# Patient Record
Sex: Male | Born: 1955 | Race: White | Hispanic: No | Marital: Married
Health system: Southern US, Community
[De-identification: ages and names within clinical notes are randomized; demographics above are authoritative.]

---

## 2012-06-03 LAB — DRUG SCREEN, URINE
Amphetamines, Ur Screen: NEGATIVE (ref ?–1000)
Barbiturates, Ur Screen: NEGATIVE (ref ?–200)
Cannabinoid 50 Ng, Ur ~~LOC~~: NEGATIVE (ref ?–50)
Cocaine Metabolite,Ur ~~LOC~~: NEGATIVE (ref ?–300)
Tricyclic, Ur Screen: NEGATIVE (ref ?–1000)

## 2012-06-03 LAB — CBC
HCT: 43 % (ref 40.0–52.0)
MCH: 34.4 pg — ABNORMAL HIGH (ref 26.0–34.0)
MCV: 96 fL (ref 80–100)
Platelet: 165 10*3/uL (ref 150–440)
RBC: 4.47 10*6/uL (ref 4.40–5.90)
RDW: 12.8 % (ref 11.5–14.5)

## 2012-06-03 LAB — URINALYSIS, COMPLETE
Bilirubin,UR: NEGATIVE
Glucose,UR: NEGATIVE mg/dL (ref 0–75)
Leukocyte Esterase: NEGATIVE
Ph: 6 (ref 4.5–8.0)
Specific Gravity: 1.004 (ref 1.003–1.030)
Squamous Epithelial: NONE SEEN
WBC UR: <1 /HPF (ref 0–5)

## 2012-06-03 LAB — TSH: Thyroid Stimulating Horm: 1.73 u[IU]/mL

## 2012-06-03 LAB — COMPREHENSIVE METABOLIC PANEL
Chloride: 102 mmol/L (ref 98–107)
Co2: 27 mmol/L (ref 21–32)
Creatinine: 0.69 mg/dL (ref 0.60–1.30)
EGFR (Non-African Amer.): >60
Glucose: 107 mg/dL — ABNORMAL HIGH (ref 65–99)
Total Protein: 6.6 g/dL (ref 6.4–8.2)

## 2012-06-03 LAB — ETHANOL: Ethanol %: 0.246 % — ABNORMAL HIGH (ref 0.000–0.080)

## 2012-06-05 ENCOUNTER — Inpatient Hospital Stay: Payer: Self-pay | Admitting: Psychiatry

## 2013-01-30 LAB — CBC
HGB: 16 g/dL (ref 13.0–18.0)
MCH: 32.7 pg (ref 26.0–34.0)
Platelet: 137 10*3/uL — ABNORMAL LOW (ref 150–440)
RBC: 4.91 10*6/uL (ref 4.40–5.90)
RDW: 15.5 % — ABNORMAL HIGH (ref 11.5–14.5)
WBC: 6.8 10*3/uL (ref 3.8–10.6)

## 2013-01-30 LAB — ETHANOL: Ethanol %: 0.233 % — ABNORMAL HIGH (ref 0.000–0.080)

## 2013-01-30 LAB — TSH: Thyroid Stimulating Horm: 1.14 u[IU]/mL

## 2013-01-31 ENCOUNTER — Inpatient Hospital Stay: Payer: Self-pay | Admitting: Psychiatry

## 2013-05-31 ENCOUNTER — Emergency Department: Payer: Self-pay | Admitting: Emergency Medicine

## 2013-06-07 ENCOUNTER — Emergency Department: Payer: Self-pay | Admitting: Internal Medicine

## 2013-08-30 ENCOUNTER — Ambulatory Visit: Payer: Self-pay | Admitting: Otolaryngology

## 2013-11-29 LAB — COMPREHENSIVE METABOLIC PANEL
Albumin: 3.9 g/dL (ref 3.4–5.0)
Alkaline Phosphatase: 93 U/L (ref 50–136)
Anion Gap: 8 (ref 7–16)
Bilirubin,Total: 0.5 mg/dL (ref 0.2–1.0)
Calcium, Total: 8.2 mg/dL — ABNORMAL LOW (ref 8.5–10.1)
EGFR (Non-African Amer.): >60
Glucose: 115 mg/dL — ABNORMAL HIGH (ref 65–99)
Sodium: 138 mmol/L (ref 136–145)
Total Protein: 7.4 g/dL (ref 6.4–8.2)
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
## 2014-04-30 ENCOUNTER — Ambulatory Visit: Payer: Self-pay | Admitting: Ophthalmology

## 2014-06-29 IMAGING — CT CT NECK-CHEST W/ CM
2 series · 10 of 14 positions shown, 12 images · IV contrast (isovue)
Comparison: None

REASON FOR EXAM: left neck mass at neck base
COMMENTS:

PROCEDURE:     KCT - KCT NECK AND CHEST WITH CONTRAST  - August 30, 2013  [DATE]
RESULT:     CT NECK, CHEST
HISTORY: Left neck mass
TECHNIQUE: Multiple axial images obtained of the neck and chest, without
p.o. contrast and with  75 ml of Isovue 370 intravenous contrast.

[Series 2: neck_chest w/ 3.0 i41f 3 · axial · 0.74mm/px · z∈[-463,-49]mm · 6 of 174 slices shown, 8 images]
[im 25/174  soft-tissue]
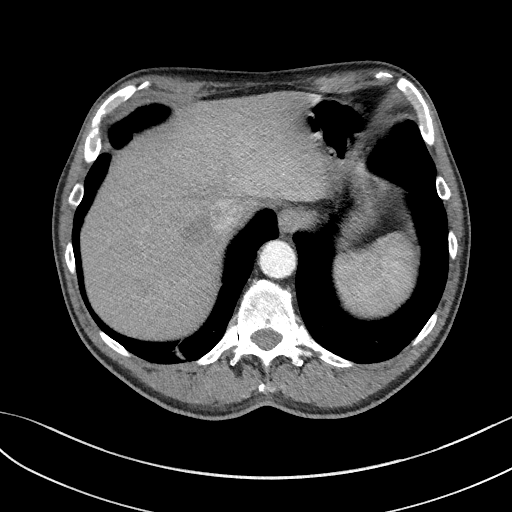
[im 25/174  bone]
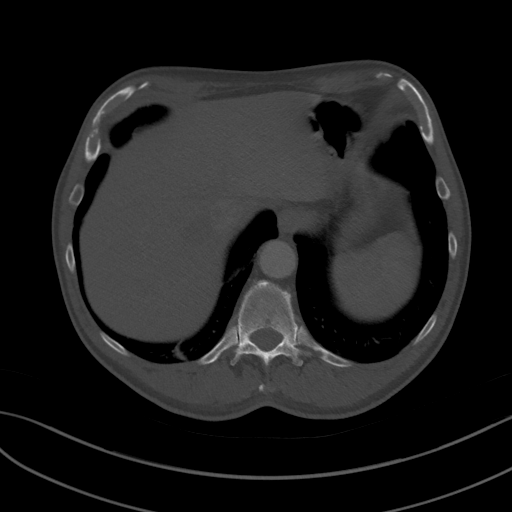
[im 50/174  bone]
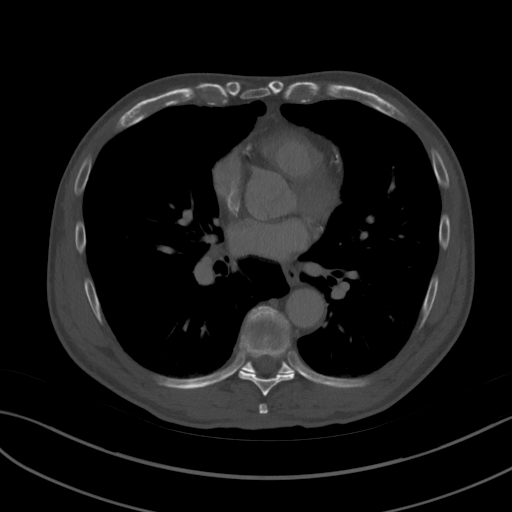
[im 75/174  bone]
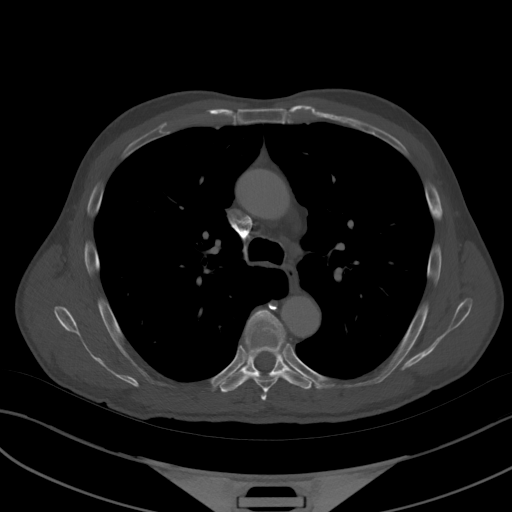
[im 99/174  bone]
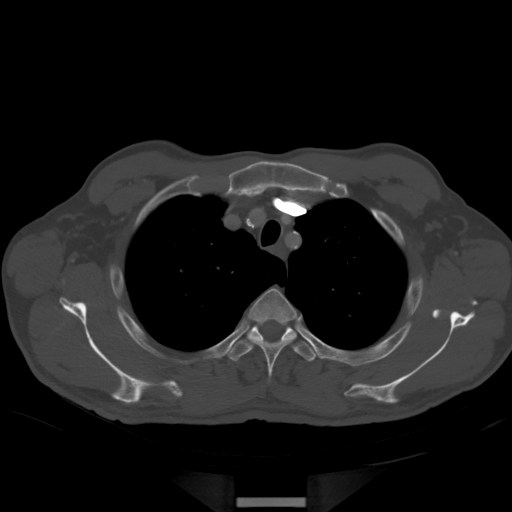
[im 124/174  soft-tissue]
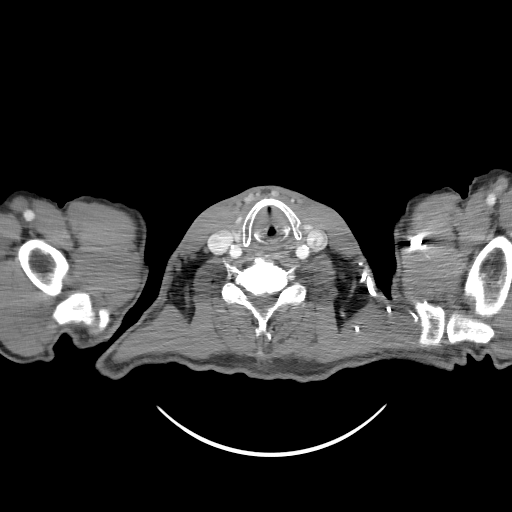
[im 124/174  bone]
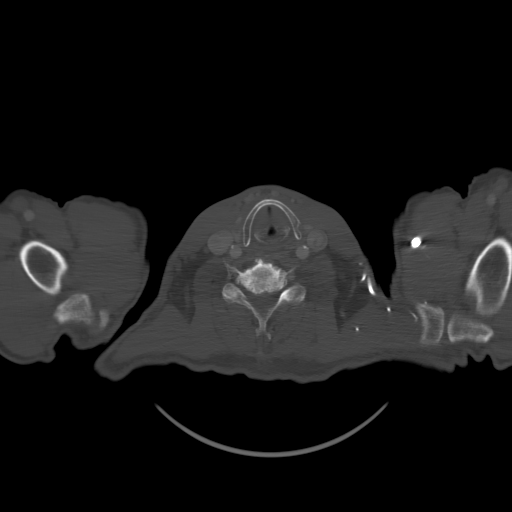
[im 149/174  bone]
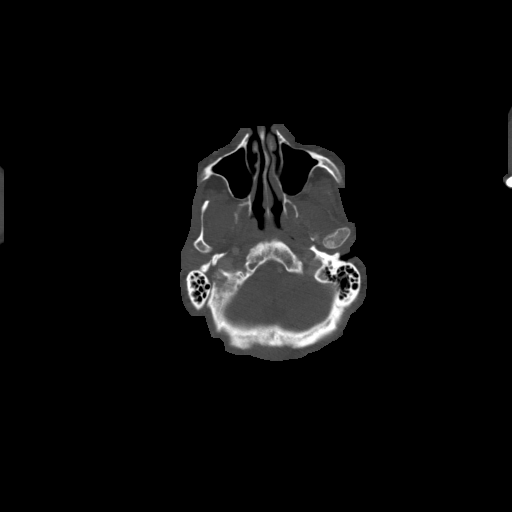

[Series 4: lung · axial · 0.74mm/px · z∈[-468,-255]mm · 4 of 119 slices shown]
[im 24/119  bone]
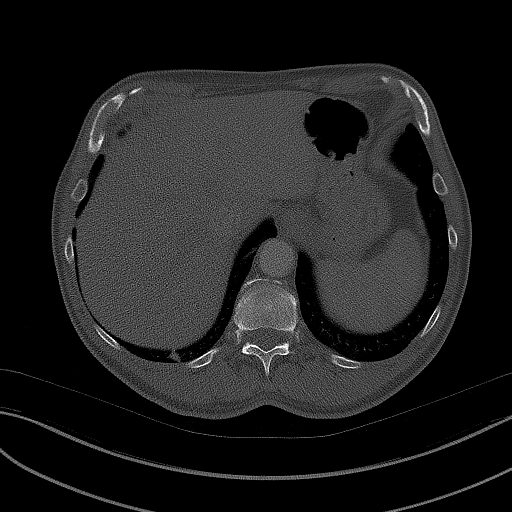
[im 48/119  bone]
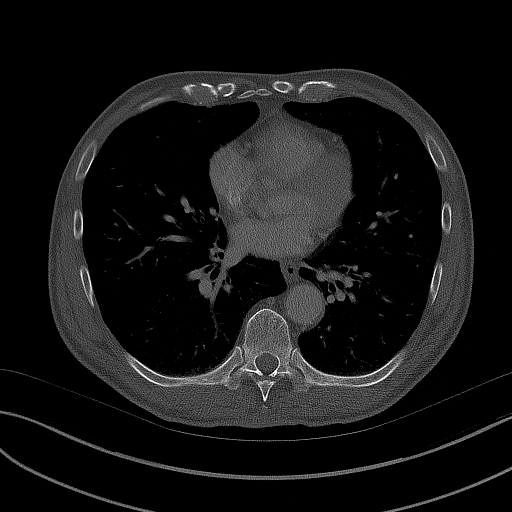
[im 71/119  bone]
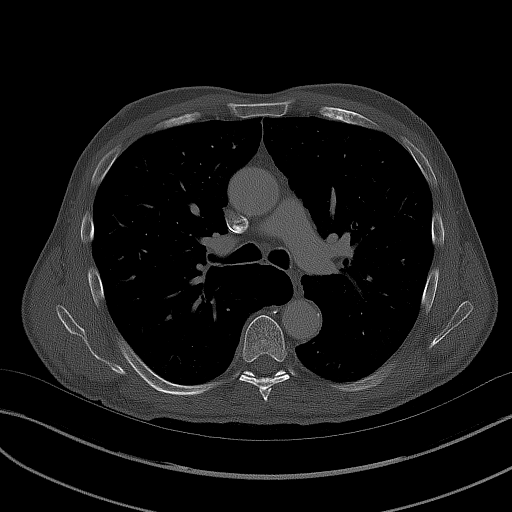
[im 95/119  bone]
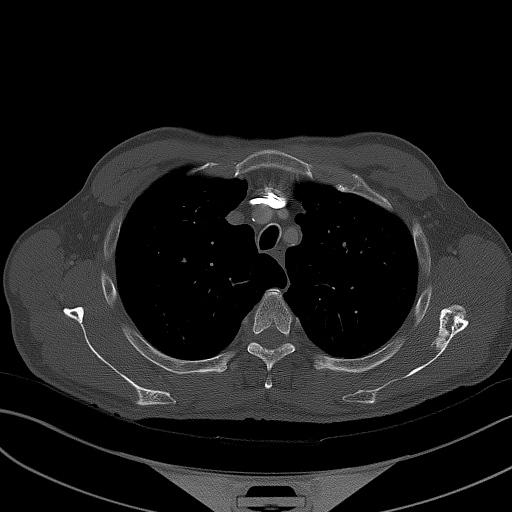

[10 of 14 positions shown; findings below may reference images not displayed]

FINDINGS: NECK:

There is no lymphadenopathy. There is no nasopharyngeal or oropharyngeal
mass. There is no focal fluid collection. The airway is patent. The
visualized portions of the carotid arteries and internal jugular veins are
patent. There is bilateral carotid artery atherosclerosis.

The visualized portions of the brain demonstrate no focal abnormality.

The paranasal sinuses are clear.

There is no lytic or blastic osseous lesion.

CHEST:

The lungs are clear. There bilateral emphysematous changes. There is a small
area of scarring at the right lung base. There is no focal mass. There is no
focal parenchymal opacity, pleural effusion, or pneumothorax.

The heart size is normal. There is no pericardial effusion. There is
coronary artery atherosclerosis in the left main, LAD and right coronary
artery.

There are no pathologically enlarged mediastinal, hilar, or axillary lymph
nodes.

There is a healing versus healed left posterior eleventh rib fracture.
IMPRESSION: 1. There is no evidence of a mass at the site of clinical palpable
abnormality in the left anterolateral lower neck.

2. Coronary artery disease.

3. Carotid artery atherosclerosis bilaterally.

[REDACTED]

## 2015-04-18 NOTE — Consult Note (Signed)
Brief Consult Note: Diagnosis: alcohol dep.   Patient was seen by consultant.   Consult note dictated.   Orders entered.   Comments: Psychiatry: Alcohol dep. [facility]. Orders done.  Electronic Signatures: Audery Amellapacs, Peityn Payton T (MD)  (Signed 05-Feb-14 09:50)  Authored: Brief Consult Note   Last Updated: 05-Feb-14 09:50 by Audery Amellapacs, Jaymon Dudek T (MD)

## 2015-04-18 NOTE — Discharge Summary (Signed)
PATIENT NAME:  Blake Villa, Blake Villa MR#:  [id number] DATE OF BIRTH:  11-17-1956  DATE OF ADMISSION:  01/31/2013  DATE OF DISCHARGE:  02/02/2013  HOSPITAL COURSE: See dictated history and physical for details of admission. This 59 year old man with a history of alcohol dependence came to the hospital requesting detox. He was treated with the usual CIWA protocol. He tolerated this well with no seizures and no sign of delirium. As of today, his vital signs are stable. He is not showing any tremor or any signs of active alcohol withdrawal. He has participated in groups appropriately. Mood is calm and stable. He is sleeping well. He was educated about the importance of staying sober and was agreeable to a referral to the Intensive Outpatient Program. Dr. Maisie Fushomas from the IOP saw him today and has excepted him. The plan is for discharge today with the patient to follow-up with IOP and with Alcoholics Anonymous.   DISCHARGE MEDICATIONS: Albuterol measured dose inhaler 2 puffs q.4 hours p.r.n. shortness of breath, Zocor 20 mg at night, trazodone 100 mg at night alternating with Remeron 15 mg at night for sleep.   LABORATORY RESULTS: Thyroid stimulating hormone normal at 1.1. Admission alcohol level 233. Calcium low at 8.2. Glucose slightly high at 115, AST elevated at 46. Platelet count low at 137.   MENTAL STATUS EXAM: Neatly dressed and groomed man. Good eye contact. Normal psychomotor activity. Speech normal rate, tone, and volume. Affect euthymic, reactive and appropriate. Mood stated as good. Thoughts are lucid and directed with no evidence of loosening of associations or delusional thinking. Denies suicidal or homicidal ideation. Shows good insight and judgment. Normal intelligence.   DISPOSITION: Discharge home. Follow up with AA and IOP program.   DIAGNOSIS PRINCIPLE AND PRIMARY:  AXIS I: Alcohol dependence.   SECONDARY DIAGNOSES:  AXIS I: No further.  AXIS II: No diagnosis.  AXIS III: Asthma.  AXIS  IV: Moderate chronic stress from his substance abuse problem and work difficulties.  AXIS V: Functioning at time of discharge 58.   ____________________________ Blake AmelJohn T. Wise Fees, MD jtc:jm Villa: 02/02/2013 18:05:14 ET T: 02/03/2013 13:24:40 ET JOB#: [id number]  cc: Blake AmelJohn T. Adonai Helzer, MD, <Dictator> Blake AmelJOHN T Zayyan Mullen MD ELECTRONICALLY SIGNED 02/05/2013 18:16

## 2015-04-18 NOTE — H&P (Signed)
PATIENT NAME:  Blake Villa, Blake Villa MR#:  [id number] DATE OF BIRTH:  12/15/1956  DATE OF ADMISSION:  01/30/2013  IDENTIFYING INFORMATION AND CHIEF COMPLAINT: A 59 year old man with a history of alcohol abuse, presented himself to the Emergency Room requesting detox.   CHIEF COMPLAINT:  "I need to get detox."   HISTORY OF PRESENT ILLNESS:  The patient states that he had been sober for several months, but he relapsed and for the last 2 weeks has been on a binge. He has been drinking alcohol pretty much all the way through the day to keep himself intoxicated around the clock. Recently, a friend of his had moved into his house briefly, who was also drinking, which seems to have escalated it. The patient realized in the last couple of days that he was running out of money and would not be able to do his job given the way he is drinking. He feels like he is unable to stop drinking on his own at home, because he is unable to resist the temptation of alcohol. He values his job and is afraid of losing it. He came to the hospital requesting detox. His mood has just been a little bit shaky. Does not report suicidal ideation. Does not report psychotic symptoms. He denies that he has been abusing any other drugs.   PAST PSYCHIATRIC HISTORY:  The patient has had a long history of alcohol dependence. He has managed to have extended periods of sobriety in the past. He claims that Alcoholics Anonymous has been effective for him, and he says that he has been actively involved in a group in [place]. He does not report any history of delirium tremens. Does not report any history of alcohol withdrawal seizures. It does not appear that he has been treated for mental health problems other than his alcohol abuse in the past. No history of suicide attempts or violence.   ALLERGIES: No known drug allergies.    SOCIAL HISTORY:  The patient lives by himself, although it sounds like he has some social support in the area. 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 He does have insurance.   PAST MEDICAL HISTORY:  The patient has COPD from chronic smoking. Also, apparently has dyslipidemia and has been treated with simvastatin.   FAMILY HISTORY:  Not stated.   REVIEW OF SYSTEMS:  Complains of feeling shaky. Tired. Feeling like he cannot stop drinking on his own. Mood is feeling somewhat nervous. Denies suicidal or homicidal ideation. Has had fleeting occasional visual illusions, but it does not sound like he has had full-blown hallucinosis.   MENTAL STATUS EXAM:  Disheveled man who looks his stated age, cooperative and pleasant in the interview. Good eye contact. Psychomotor activity a bit slowed. Speech: Easy to understand. Affect blunted. Mood stated as being nervous. Thoughts are lucid. No loosening of associations or delusions. Denies auditory or visual hallucinations currently. Denies suicidal or homicidal ideation. The patient appears to be of average intelligence. Alert and oriented x4 and has reasonable insight and judgment.   PHYSICAL EXAMINATION: GENERAL:  The patient has a mild intention tremor when he moves. When he stands up, he gets a little bit unsteady on his feet, but does not fall over. No obvious skin lesions.  HEENT:  Pupils equal and reactive. Face is symmetric.  NECK AND BACK:  Not tender to light touch.  NEUROLOGIC:  Strength and  reflexes normal throughout. Normal gait except for being a little bit unsteady.  LUNGS:  Clear with no wheezes.  HEART:  Regular rate and rhythm.  ABDOMEN:  Soft, nontender, normal bowel sounds.  VITAL SIGNS:  Pulse 88, respirations 19, blood pressure 159/89, temperature 97.9.   LABORATORY RESULTS:  Alcohol level last night, at about 6:00, was 233. TSH normal at 1.4. Chemistry showed a slightly elevated glucose at 115, calcium low at 8.2, AST elevated at 46. CBC showed a low platelet count at 137.    ASSESSMENT:  This is a 59 year old man with alcohol dependence, presents to the hospital requesting detox. He was intoxicated yesterday, and is shaky and showing some mild symptoms of alcohol withdrawal. Has not been able to maintain sobriety on his own. Significantly impaired by his drinking. Needs admission to the hospital for detox.   TREATMENT PLAN: Admit to psychiatry. Usual CIWA protocol. Continue on albuterol inhaler and simvastatin. Include him in groups and activities for education and substance abuse treatment. I have recommended to him that he consider going to the intensive outpatient program, and he says he will consider it.   DIAGNOSIS PRINCIPLE AND PRIMARY:  AXIS I:  Alcohol dependence.   SECONDARY DIAGNOSES: AXIS I:  No further axis.  AXIS II:  Deferred.  AXIS III:  Alcohol withdrawal, chronic obstructive pulmonary disease, dyslipidemia.  AXIS IV:  Moderate from ongoing worry about the loss of his job and lack of resources.  AXIS V:  Functioning at time of admission 40.      ____________________________ Blake AmelJohn T. Clapacs, MD jtc:dm D: 01/31/2013 09:57:00 ET T: 01/31/2013 10:14:36 ET JOB#: [id number]  cc: Blake AmelJohn T. Clapacs, MD, <Dictator> Blake AmelJOHN T CLAPACS MD ELECTRONICALLY SIGNED 02/01/2013 0:23

## 2015-04-18 NOTE — Consult Note (Signed)
Consult: Outpatient treatment recommendations Patient was interviewed and oriented to the CD-IOP prior to this discharge and was seen as an excellent candidate for treatment at this level of care and was also able to agree that at this inpatient discharge he plans to attend the CD-IOP. Dr. Carola FrostLee Ann Roush, PsyD, HSP-P informed of plans of patient to attend the [facility] CD-IOP. Plans are to meet with patient Tuesday at 4PM to complete admission intake in order to He has agreed to participate in the CD-IOP treatment at this discharge starting Tuesday February 02, 2005.   Electronic Signatures: Huel Cotehomas, Richard (PsyD)  (Signed on 07-Feb-14 18:20)  Authored  Last Updated: 07-Feb-14 18:20 by Huel Cotehomas, Richard (PsyD)

## 2015-04-19 NOTE — Op Note (Signed)
PATIENT NAME:  Blake SnookMARTIN, Torey D MR#:  [id number] DATE OF BIRTH:  August 14, 1956  DATE OF PROCEDURE:  Aug 05, 202015  LOCATION:  [facility]  PREOPERATIVE DIAGNOSIS: Visually significant cataract of the right eye.   POSTOPERATIVE DIAGNOSIS: Visually significant cataract of the right eye.   OPERATIVE PROCEDURE: Cataract extraction by phacoemulsification with implant of intraocular lens to right eye.   SURGEON: Galen ManilaWilliam Abigial Newville, MD.   ANESTHESIA:  1. Managed anesthesia care.  2. Topical tetracaine drops followed by 2% Xylocaine jelly applied in the preoperative holding area.   COMPLICATIONS: None.   TECHNIQUE:  Stop and chop.   DESCRIPTION OF PROCEDURE: The patient was examined and consented in the preoperative holding area where the aforementioned topical anesthesia was applied to the right eye and then brought back to the operating room where the right eye was prepped and draped in the usual sterile ophthalmic fashion and a lid speculum was placed. A paracentesis was created with the side port blade and the anterior chamber was filled with viscoelastic. A near clear corneal incision was performed with the steel keratome. A continuous curvilinear capsulorrhexis was performed with a cystotome followed by the capsulorrhexis forceps. Hydrodissection and hydrodelineation were carried out with BSS on a blunt cannula. The lens was removed in a stop and chop technique and the remaining cortical material was removed with the irrigation-aspiration handpiece. The capsular bag was inflated with viscoelastic and the Tecnis ZCB00 20.5-diopter lens, serial number [id number] was placed in the capsular bag without complication. The remaining viscoelastic was removed from the eye with the irrigation-aspiration handpiece. The wounds were hydrated. The anterior chamber was flushed with Miostat and the eye was inflated to physiologic pressure. 0.1 mL of cefuroxime concentration 10 mg/mL was placed in the anterior  chamber. The wounds were found to be water tight. The eye was dressed with Vigamox and Combigan. The patient was given protective glasses to wear throughout the day and a shield with which to sleep tonight. The patient was also given drops with which to begin a drop regimen today and will follow up with me in one day.   ____________________________ Jerilee FieldWilliam L. Kairie Vangieson, MD wlp:dd D: Aug 05, 202015 22:33:06 ET T: 05/01/2014 04:17:47 ET JOB#: [id number]  cc: Alohilani Levenhagen L. Narjis Mira, MD, <Dictator> Jerilee FieldWILLIAM L Dal Blew MD ELECTRONICALLY SIGNED 05/09/2014 13:29

## 2015-04-20 NOTE — H&P (Signed)
PATIENT NAME:  Blake Villa, Blake Villa MR#:  [id number] DATE OF BIRTH:  29-Sep-1956  DATE OF ADMISSION:  06/05/2012  REFERRING PHYSICIAN: Daryel NovemberJonathan Williams, MD   ATTENDING PHYSICIAN: Blake LineaJolanta Xabi Wittler, MD   IDENTIFYING DATA: Mr. Blake Villa is a [age] year old male with history of alcoholism.   CHIEF COMPLAINT: " I need detox."   HISTORY OF PRESENT ILLNESS: Mr. Blake Villa has been drinking since the age of 59. His drinking escalated nine months ago when he split with his wife. He now lives alone and drinks every day after work, 6 to 12 beers a day. He started missing work last week and decided to come for detox. He denies symptoms of depression, anxiety, or psychosis. He denies other than alcohol substance use, including illicit substances. He does not have enough time of work to participate in alcohol rehabilitation and wants to return to work following detox, maybe with intensive outpatient program following discharge.   PAST PSYCHIATRIC HISTORY: He has been drinking since the age of 59, no period of sobriety to speak of. This contributed to dissipation of his marriage. He is in a new relationship with a woman who does not drink and hopes that when he sobers up it will work well for him. He denies ever being hospitalized. No suicide attempts. No treatment.   FAMILY PSYCHIATRIC HISTORY: Multiple family members with alcoholism.   PAST MEDICAL HISTORY:  1. Chronic obstructive pulmonary disease.  2. Dyslipidemia.  3. Recent sinus infection, on amoxicillin.   ALLERGIES: No known drug allergies.   MEDICATIONS ON ADMISSION:  1. Albuterol inhaler.  2. Simvastatin 20 mg at night. 3. Amoxicillin 250 mg b.i.Villa.    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 He considers it a stressful job but likes it and is good at it. He has been missing work lately and decided to come for treatment. He is still  married but shortly will be divorced. He lives alone in a place where he has no neighbors. When he comes back from work, he sits at home and drinks until he falls asleep with his curtains closed. He does have a girlfriend. She spends weekends with [place] She seems to have a good influence on him. He would hate to lose the job and will do his best to maintain sobriety.    REVIEW OF SYSTEMS: CONSTITUTIONAL: No fevers or chills. No weight changes. EYES: No double or blurred vision. ENT: No hearing loss. RESPIRATORY: No shortness of breath or cough. CARDIOVASCULAR: No chest pain or orthopnea. GASTROINTESTINAL: No abdominal pain, nausea, vomiting, or diarrhea. GU: No incontinence or frequency. ENDOCRINE: No heat or cold intolerance. LYMPHATIC: No anemia or easy bruising. INTEGUMENTARY: No acne or rash. MUSCULOSKELETAL: No muscle or joint pain. NEUROLOGIC: No tingling or weakness. PSYCHIATRIC: See history of present illness for details.   PHYSICAL EXAMINATION:  VITAL SIGNS: Blood pressure 139/97, pulse 90, respirations 18, temperature 99.   GENERAL: This is well-developed male in no acute distress.   HEENT: The pupils are equal, round, and reactive to light. Sclerae are anicteric.   NECK: Supple. No thyromegaly.   LUNGS: Clear to auscultation. No dullness to percussion.   HEART: Regular rhythm and rate. No murmurs, rubs, or gallops.   ABDOMEN: Soft, nontender, nondistended. Positive bowel sounds.   MUSCULOSKELETAL: Normal muscle strength in all extremities.   SKIN: No rashes  or bruises.   LYMPHATIC: No cervical adenopathy.   NEUROLOGIC: Cranial nerves II through XII are intact. Normal gait.   LABORATORY, DIAGNOSTIC AND RADIOLOGICAL DATA:  Chemistries are within normal limits except for blood glucose of 107, potassium 3.3, calcium 7.8.  Blood alcohol level on admission 0.246.  LFTs are within normal limits except for AST of 48. TSH 1.73.  Urine tox  screen negative for substances.  CBC within normal limits.  Urinalysis is not suggestive of urinary tract infection.   MENTAL STATUS EXAMINATION ON ADMISSION: The patient is alert and oriented to person, place, time, and situation. He is pleasant, polite, and cooperative. He is wearing private clothes. He is adequately groomed. He maintains good eye contact. His speech is of normal rhythm, rate, and volume. Mood is fine with full affect. Thought processing is logical and goal oriented. Thought content: He denies suicidal or homicidal ideation. There are no delusions or paranoia. There are no auditory or visual hallucinations. His cognition is grossly intact. He registers three out of three and recalls three out of three objects after five minutes. He can spell world forward and backward. He knows four past presidents. His insight is fair. His judgment is questionable.   SUICIDE RISK ASSESSMENT ON ADMISSION: This is a patient with a lifelong history of alcoholism who has not been able to stop drinking and came to the hospital for detox.   ASSESSMENT:  AXIS I: Alcohol dependence.   AXIS II: Deferred.   AXIS III:  1. Dyslipidemia.  2. Sinus infection.  3. Chronic obstructive pulmonary disease.   AXIS IV: Substance abuse, recent divorce, problems at work, primary support.   AXIS V: Global Assessment of Functioning score is 35.  PLAN: The patient was admitted to [facility] for safety, stabilization and medication management. He was initially placed on suicide precautions and was closely monitored for any unsafe behaviors. He underwent full psychiatric and risk assessment. He received pharmacotherapy, individual and group psychotherapy, substance abuse counseling, and support from therapeutic milieu.   1. Alcohol detox: The patient was placed on standard CIWA protocol. He will be monitored for symptoms of alcohol withdrawal.  2. Substance abuse  treatment: The patient feels that he cannot afford to go to a rehabilitation. He is interested in an intensive outpatient program.  3. Dr. Guss Bunde, who admitted the patient, prescribed Remeron at night. We will continue it for now.  4. Medical: We will continue albuterol, simvastatin and Augmentin for his medical problems.  5. Disposition: He will be discharged to home and hopefully will follow up with Dr. Maisie Fus at IOP.   ____________________________ Ellin Goodie Jennet Maduro, MD jbp:cbb Villa: 06/05/2012 17:18:16 ET T: 06/05/2012 19:02:00 ET JOB#: [id number]  cc: Kamarie Veno B. Jennet Maduro, MD, <Dictator> Shari Prows MD ELECTRONICALLY SIGNED 06/08/2012 21:57

## 2015-04-20 NOTE — Consult Note (Signed)
Patient was seen at request of Attending MD for orientation to the CD-IOP. Patient was orientated and indicated disinterest in the CD-IOP at this point of his life.  He was able to report reasons as to how it was that he came to the hospital and indicated plans to remain abstinence of addictive drugs.  He expressed plans to return to work, follow AA, encourage positive relations with significant other.     Electronic Signatures: Huel Cotehomas, Richard (PsyD)  (Signed on 11-Jun-13 16:21)  Authored  Last Updated: 11-Jun-13 16:21 by Huel Cotehomas, Richard (PsyD)

## 2019-05-28 DEATH — deceased
